# Patient Record
Sex: Male | Born: 1995 | Race: Black or African American | Hispanic: No | Marital: Single | Smoking: Never smoker
Health system: Southern US, Community
[De-identification: ages and names within clinical notes are randomized; demographics above are authoritative.]

## PROBLEM LIST (undated history)

## (undated) DIAGNOSIS — F909 Attention-deficit hyperactivity disorder, unspecified type: Secondary | ICD-10-CM

## (undated) HISTORY — PX: NO PAST SURGERIES: SHX2092

---
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
## 2017-03-31 ENCOUNTER — Emergency Department
Admission: EM | Admit: 2017-03-31 | Discharge: 2017-03-31 | Disposition: A | Discharge status: Home / Self Care | Payer: Medicaid Other | Attending: Student in an Organized Health Care Education/Training Program | Admitting: Student in an Organized Health Care Education/Training Program

## 2017-03-31 DIAGNOSIS — K668 Other specified disorders of peritoneum: Secondary | ICD-10-CM

## 2017-03-31 DIAGNOSIS — R1907 Generalized intra-abdominal and pelvic swelling, mass and lump: Secondary | ICD-10-CM | POA: Insufficient documentation

## 2017-03-31 DIAGNOSIS — R109 Unspecified abdominal pain: Secondary | ICD-10-CM | POA: Diagnosis present

## 2017-03-31 MED ORDER — IBUPROFEN 600 MG PO TABS
600.0000 mg | ORAL_TABLET | Freq: Once | ORAL | Status: AC
  Administered 2017-03-31: 600 mg via ORAL
  Filled 2017-03-31: qty 1

## 2017-03-31 MED ORDER — SULFAMETHOXAZOLE-TRIMETHOPRIM 800-160 MG PO TABS: 1.0000 | ORAL_TABLET | Freq: Two times a day (BID) | ORAL | 0 refills | Status: DC

## 2017-03-31 MED ORDER — SULFAMETHOXAZOLE-TRIMETHOPRIM 800-160 MG PO TABS
1.0000 | ORAL_TABLET | Freq: Once | ORAL | Status: AC
  Administered 2017-03-31: 1 via ORAL
  Filled 2017-03-31: qty 1

## 2017-03-31 MED ORDER — OXYCODONE-ACETAMINOPHEN 5-325 MG PO TABS
1.0000 | ORAL_TABLET | Freq: Once | ORAL | Status: AC
  Administered 2017-03-31: 1 via ORAL
  Filled 2017-03-31: qty 1

## 2017-03-31 MED ORDER — LIDOCAINE HCL (PF) 1 % IJ SOLN
INTRAMUSCULAR | Status: AC
  Filled 2017-03-31: qty 5

## 2017-03-31 MED ORDER — TRAMADOL HCL 50 MG PO TABS: 50.0000 mg | ORAL_TABLET | Freq: Two times a day (BID) | ORAL | 0 refills | Status: DC | PRN

## 2017-03-31 MED ORDER — ONDANSETRON HCL 8 MG PO TABS: 8.0000 mg | ORAL_TABLET | Freq: Three times a day (TID) | ORAL | 0 refills | Status: DC | PRN

## 2017-03-31 NOTE — ED Notes (Signed)
See triage note  States he noticed a "knot" to mid abd about 3 months ago   Denies any injury  Or n/v/d  On arrival abd flat  And slightly tender on palpation

## 2017-03-31 NOTE — ED Triage Notes (Addendum)
Pt came to ED via pov c/o feeling a knot in stomach going on for past 3 months, having pain.

## 2017-03-31 NOTE — ED Provider Notes (Signed)
[facility] Emergency Department Provider Note   ____________________________________________   First MD Initiated Contact with Patient 03/31/17 1459     (approximate)  I have reviewed the triage vital signs and the nursing notes.   HISTORY  Chief Complaint Abdominal Pain    HPI Kyle Porter is a 21 y.o. male patient present with mother for complaint of a mass above the umbilicus with past 3 months.Patient states has increased in size past 3 weeks. Patient denies provocative or palliative measures for complaint. Mother and patient have cell phones on attempting to live stream any procedure performed today.advised patient and mother about policy against recording procedures.mother states she does not believe there is a policy at the exit  He room to get her a copy. After reading the policy  mother put down her cell phone but the patient refused to put down his cell phone and continue talking stating he is not recording. Patient refused to let me see the front of the phone.  History reviewed. No pertinent past medical history.  There are no active problems to display for this patient.   History reviewed. No pertinent surgical history.  Prior to Admission medications   Medication Sig Start Date End Date Taking? Authorizing Provider  HYDROcodone-acetaminophen (NORCO) 5-325 MG per tablet Take 1 tablet by mouth every 4 (four) hours as needed for moderate pain. 03/24/15   Ignacia Bayley, PA-C  sulfamethoxazole-trimethoprim (BACTRIM DS,SEPTRA DS) 800-160 MG tablet Take 1 tablet by mouth 2 (two) times daily. 03/31/17   Joni Reining, PA-C  traMADol (ULTRAM) 50 MG tablet Take 1 tablet (50 mg total) by mouth every 12 (twelve) hours as needed. 03/31/17   Joni Reining, PA-C    Allergies Patient has no known allergies.  No family history on file.  Social History Social History  Substance Use Topics  . Smoking status: Not on file  . Smokeless tobacco: Not on  file  . Alcohol use Not on file    Review of Systems  Constitutional: No fever/chills Eyes: No visual changes. ENT: No sore throat. Cardiovascular: Denies chest pain. Respiratory: Denies shortness of breath. Gastrointestinal: No abdominal pain.  No nausea, no vomiting.  No diarrhea.  No constipation. Genitourinary: Negative for dysuria. Musculoskeletal: Negative for back pain. Skin: Negative for rash. Nodule lesion superior aspect of umbilicus. Neurological: Negative for headaches, focal weakness or numbness.   ____________________________________________   PHYSICAL EXAM:  VITAL SIGNS: ED Triage Vitals  Enc Vitals Group     BP 03/31/17 1257 133/61     Pulse Rate 03/31/17 1257 76     Resp 03/31/17 1257 16     Temp 03/31/17 1257 97.9 F (36.6 C)     Temp Source 03/31/17 1257 Oral     SpO2 03/31/17 1257 100 %     Weight 03/31/17 1256 160 lb (72.6 kg)     Height 03/31/17 1256  (1.854 m)     Head Circumference --      Peak Flow --      Pain Score --      Pain Loc --      Pain Edu? --      Excl. in GC? --     Constitutional: Alert and oriented. Well appearing and in no acute distress. Cardiovascular: Normal rate, regular rhythm. Grossly normal heart sounds.  Good peripheral circulation. Respiratory: Normal respiratory effort.  No retractions. Lungs CTAB. Gastrointestinal: Soft and nontender. No distention. No abdominal bruits. No CVA tenderness.  Musculoskeletal: No lower extremity tenderness nor edema.  No joint effusions. Neurologic:  Normal speech and language. No gross focal neurologic deficits are appreciated. No gait instability. Skin:  Skin is warm, dry and intact. No rash noted. 1 cm nodule lesion uperior aspect of the umbilicus. Lesions on erythematous base. Psychiatric: Mood and affect are normal. Speech and behavior are normal.  ____________________________________________   LABS (all labs ordered are listed, but only abnormal results are  displayed)  Labs Reviewed - No data to display ____________________________________________  EKG   ____________________________________________  RADIOLOGY  No results found.  ____________________________________________   PROCEDURES  Procedure(s) performed: INCISION AND DRAINAGE Performed by: Joni Reining Consent: Verbal consent obtained. Risks and benefits: risks, benefits and alternatives were discussed Type: abscess  Body area: superior aspect of her umbilicus Anesthesia: local infiltration  Incision was made with a scalpel.  Local anesthetic: lidocaine 1% without epinephrine  Anesthetic total: 3ml  Complexity: complex Blunt dissection to break up loculations  Drainage: purulent  Drainage amount: small  Verline Lema was cleaned and bandaged.  Patient tolerance: Patient tolerated the procedure well with no immediate complications.     Procedures  Critical Care performed: No  ____________________________________________   INITIAL IMPRESSION / ASSESSMENT AND PLAN / ED COURSE  Pertinent labs & imaging results that were available during my care of the patient were reviewed by me and considered in my medical decision making (see chart for details).  Infected cyst superior aspect of umbilicus. Area was incised and drained. Patient given discharge care instructions and work note  Advised take medication as directed.      ____________________________________________   FINAL CLINICAL IMPRESSION(S) / ED DIAGNOSES  Final diagnoses:  Cystic lesion of abdominal viscera      NEW MEDICATIONS STARTED DURING THIS VISIT:  New Prescriptions   SULFAMETHOXAZOLE-TRIMETHOPRIM (BACTRIM DS,SEPTRA DS) 800-160 MG TABLET    Take 1 tablet by mouth 2 (two) times daily.   TRAMADOL (ULTRAM) 50 MG TABLET    Take 1 tablet (50 mg total) by mouth every 12 (twelve) hours as needed.     Note:  This document was prepared using Dragon voice recognition software and may include  unintentional dictation errors.    Joni Reining, PA-C 03/31/17 1601    Willy Eddy, MD 03/31/17 [phone]

## 2017-09-03 ENCOUNTER — Emergency Department: Payer: Self-pay

## 2017-09-03 ENCOUNTER — Encounter: Payer: Self-pay | Admitting: Emergency Medicine

## 2017-09-03 ENCOUNTER — Emergency Department
Admission: EM | Admit: 2017-09-03 | Discharge: 2017-09-03 | Disposition: A | Discharge status: Home / Self Care | Payer: Self-pay | Attending: Emergency Medicine | Admitting: Emergency Medicine

## 2017-09-03 ENCOUNTER — Other Ambulatory Visit: Payer: Self-pay

## 2017-09-03 DIAGNOSIS — R040 Epistaxis: Secondary | ICD-10-CM | POA: Insufficient documentation

## 2017-09-03 DIAGNOSIS — Z79899 Other long term (current) drug therapy: Secondary | ICD-10-CM | POA: Insufficient documentation

## 2017-09-03 DIAGNOSIS — Y929 Unspecified place or not applicable: Secondary | ICD-10-CM | POA: Insufficient documentation

## 2017-09-03 DIAGNOSIS — Y998 Other external cause status: Secondary | ICD-10-CM | POA: Insufficient documentation

## 2017-09-03 DIAGNOSIS — S0033XA Contusion of nose, initial encounter: Secondary | ICD-10-CM | POA: Insufficient documentation

## 2017-09-03 DIAGNOSIS — Y939 Activity, unspecified: Secondary | ICD-10-CM | POA: Insufficient documentation

## 2017-09-03 MED ORDER — NAPROXEN 500 MG PO TABS: 500.0000 mg | ORAL_TABLET | Freq: Two times a day (BID) | ORAL | Status: DC

## 2017-09-03 MED ORDER — TRAMADOL HCL 50 MG PO TABS
50.0000 mg | ORAL_TABLET | Freq: Once | ORAL | Status: AC
  Administered 2017-09-03: 50 mg via ORAL
  Filled 2017-09-03: qty 1

## 2017-09-03 MED ORDER — NAPROXEN 500 MG PO TABS
500.0000 mg | ORAL_TABLET | Freq: Once | ORAL | Status: AC
  Administered 2017-09-03: 500 mg via ORAL
  Filled 2017-09-03: qty 1
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
## 2017-09-03 NOTE — ED Notes (Signed)
Pt verbalizes understanding of d/c instructions, medications and follow up 
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
## 2017-09-03 NOTE — ED Notes (Signed)
Patient to Flex with Legrand ComoLinda RN.

## 2017-11-07 ENCOUNTER — Encounter: Payer: Self-pay | Admitting: Emergency Medicine

## 2017-11-07 ENCOUNTER — Emergency Department: Payer: No Typology Code available for payment source

## 2017-11-07 ENCOUNTER — Emergency Department
Admission: EM | Admit: 2017-11-07 | Discharge: 2017-11-07 | Disposition: A | Discharge status: Home / Self Care | Payer: No Typology Code available for payment source | Attending: Emergency Medicine | Admitting: Emergency Medicine

## 2017-11-07 ENCOUNTER — Other Ambulatory Visit: Payer: Self-pay

## 2017-11-07 DIAGNOSIS — Y999 Unspecified external cause status: Secondary | ICD-10-CM | POA: Diagnosis not present

## 2017-11-07 DIAGNOSIS — M62838 Other muscle spasm: Secondary | ICD-10-CM | POA: Insufficient documentation

## 2017-11-07 DIAGNOSIS — Y92410 Unspecified street and highway as the place of occurrence of the external cause: Secondary | ICD-10-CM | POA: Diagnosis not present

## 2017-11-07 DIAGNOSIS — M542 Cervicalgia: Secondary | ICD-10-CM | POA: Insufficient documentation

## 2017-11-07 DIAGNOSIS — Y9389 Activity, other specified: Secondary | ICD-10-CM | POA: Diagnosis not present

## 2017-11-07 DIAGNOSIS — S199XXA Unspecified injury of neck, initial encounter: Secondary | ICD-10-CM | POA: Diagnosis present

## 2017-11-07 HISTORY — DX: Attention-deficit hyperactivity disorder, unspecified type: F90.9

## 2017-11-07 MED ORDER — MELOXICAM 15 MG PO TABS: 15.0000 mg | ORAL_TABLET | Freq: Every day | ORAL | 2 refills | Status: AC

## 2017-11-07 MED ORDER — BACLOFEN 10 MG PO TABS: 10.0000 mg | ORAL_TABLET | Freq: Every day | ORAL | 0 refills | Status: AC

## 2017-11-07 NOTE — Discharge Instructions (Addendum)
Follow-up with your regular doctor or Dr. Rosita KeaMenz if you are not better in 5 to 7 days.  Use medications prescribed.  Apply ice to any areas that hurt.  Return to emergency department if your worsening
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
## 2017-12-14 ENCOUNTER — Other Ambulatory Visit: Payer: Self-pay

## 2017-12-14 ENCOUNTER — Encounter: Payer: Self-pay | Admitting: Emergency Medicine

## 2017-12-14 ENCOUNTER — Emergency Department
Admission: EM | Admit: 2017-12-14 | Discharge: 2017-12-14 | Disposition: A | Discharge status: Home / Self Care | Payer: Medicaid Other | Attending: Emergency Medicine | Admitting: Emergency Medicine

## 2017-12-14 DIAGNOSIS — F909 Attention-deficit hyperactivity disorder, unspecified type: Secondary | ICD-10-CM | POA: Insufficient documentation

## 2017-12-14 DIAGNOSIS — R110 Nausea: Secondary | ICD-10-CM | POA: Insufficient documentation

## 2017-12-14 DIAGNOSIS — R197 Diarrhea, unspecified: Secondary | ICD-10-CM | POA: Insufficient documentation

## 2017-12-14 DIAGNOSIS — R109 Unspecified abdominal pain: Secondary | ICD-10-CM | POA: Insufficient documentation

## 2017-12-14 DIAGNOSIS — Z79899 Other long term (current) drug therapy: Secondary | ICD-10-CM | POA: Insufficient documentation

## 2017-12-14 LAB — URINALYSIS, ROUTINE W REFLEX MICROSCOPIC
BILIRUBIN URINE: NEGATIVE
GLUCOSE, UA: NEGATIVE mg/dL
Hgb urine dipstick: NEGATIVE
KETONES UR: NEGATIVE mg/dL
LEUKOCYTES UA: NEGATIVE
Nitrite: NEGATIVE
PH: 5 (ref 5.0–8.0)
PROTEIN: NEGATIVE mg/dL
Specific Gravity, Urine: 1.017 (ref 1.005–1.030)

## 2017-12-14 LAB — COMPREHENSIVE METABOLIC PANEL
ALT: 21 U/L (ref 17–63)
AST: 24 U/L (ref 15–41)
Albumin: 5.2 g/dL — ABNORMAL HIGH (ref 3.5–5.0)
Alkaline Phosphatase: 70 U/L (ref 38–126)
Anion gap: 7 (ref 5–15)
BILIRUBIN TOTAL: 0.8 mg/dL (ref 0.3–1.2)
BUN: 9 mg/dL (ref 6–20)
CHLORIDE: 104 mmol/L (ref 101–111)
CO2: 24 mmol/L (ref 22–32)
Calcium: 9.4 mg/dL (ref 8.9–10.3)
Creatinine, Ser: 0.94 mg/dL (ref 0.61–1.24)
GFR calc Af Amer: >60 mL/min (ref >60)
Glucose, Bld: 111 mg/dL — ABNORMAL HIGH (ref 65–99)
Potassium: 3.8 mmol/L (ref 3.5–5.1)
Sodium: 135 mmol/L (ref 135–145)
Total Protein: 8.8 g/dL — ABNORMAL HIGH (ref 6.5–8.1)

## 2017-12-14 LAB — CBC
HCT: 51 % (ref 40.0–52.0)
Hemoglobin: 17.2 g/dL (ref 13.0–18.0)
MCH: 31.4 pg (ref 26.0–34.0)
MCHC: 33.7 g/dL (ref 32.0–36.0)
MCV: 93 fL (ref 80.0–100.0)
Platelets: 222 10*3/uL (ref 150–440)
RBC: 5.48 MIL/uL (ref 4.40–5.90)
RDW: 12.3 % (ref 11.5–14.5)
WBC: 9.8 10*3/uL (ref 3.8–10.6)

## 2017-12-14 LAB — LIPASE, BLOOD: LIPASE: 25 U/L (ref 11–51)

## 2017-12-14 MED ORDER — SODIUM CHLORIDE 0.9 % IV BOLUS
1000.0000 mL | Freq: Once | INTRAVENOUS | Status: AC
  Administered 2017-12-14: 1000 mL via INTRAVENOUS

## 2017-12-14 MED ORDER — LOPERAMIDE HCL 2 MG PO TABS: 2.0000 mg | ORAL_TABLET | Freq: Four times a day (QID) | ORAL | 0 refills | Status: DC | PRN

## 2017-12-14 MED ORDER — ONDANSETRON HCL 4 MG/2ML IJ SOLN
4.0000 mg | Freq: Once | INTRAMUSCULAR | Status: AC
  Administered 2017-12-14: 4 mg via INTRAVENOUS
  Filled 2017-12-14: qty 2

## 2017-12-14 MED ORDER — ONDANSETRON 4 MG PO TBDP: 4.0000 mg | ORAL_TABLET | Freq: Three times a day (TID) | ORAL | 0 refills | Status: DC | PRN

## 2017-12-14 NOTE — ED Notes (Signed)
ED Provider at bedside. 

## 2017-12-14 NOTE — ED Provider Notes (Signed)
[facility] Emergency Department Provider Note  Time seen: 3:48 PM  I have reviewed the triage vital signs and the nursing notes.   HISTORY  Chief Complaint Abdominal Pain and Diarrhea    HPI Kyle Porter is a 22 y.o. male with a past medical history of ADHD presents to the emergency department for diarrhea and abdominal cramping.  According to the patient for the past 6 or 7 days he has been having daily episodes of diarrhea and abdominal cramping.  States the abdominal cramping is largely resolved after having diarrhea.  States nausea but denies vomiting.  Denies any fever.  Denies any black or bloody stool.  Patient states he has had several bowel movements today, took Imodium for the first time today.  Denies any current abdominal pain.  Largely negative review of systems otherwise.   Past Medical History:  Diagnosis Date  . ADHD     There are no active problems to display for this patient.   History reviewed. No pertinent surgical history.  Prior to Admission medications   Medication Sig Start Date End Date Taking? Authorizing Provider  amphetamine-dextroamphetamine (ADDERALL) 30 MG tablet Take 30 mg by mouth daily.    [provider]  baclofen (LIORESAL) 10 MG tablet Take 1 tablet (10 mg total) by mouth daily. 11/07/17 11/07/18  Fisher, Roselyn BeringSusan W, PA-C  meloxicam (MOBIC) 15 MG tablet Take 1 tablet (15 mg total) by mouth daily. 11/07/17 11/07/18  Faythe GheeFisher, Susan W, PA-C    No Known Allergies  No family history on file.  Social History Social History   Tobacco Use  . Smoking status: Never Smoker  . Smokeless tobacco: Never Used  Substance Use Topics  . Alcohol use: No    Frequency: Never  . Drug use: Not on file   Review of Systems Constitutional: Negative for fever. Eyes: Negative for visual complaints  ENT: Negative for recent illness/congestion Cardiovascular: Negative for chest pain. Respiratory: Negative for shortness of  breath. Gastrointestinal: Abdominal cramping, diarrhea.  Negative for vomiting.  Occasional nausea. Genitourinary: Negative for urinary compaints Musculoskeletal: Negative for musculoskeletal complaints Skin: Negative for skin complaints  Neurological: Negative for headache All other ROS negative  ____________________________________________   PHYSICAL EXAM:  VITAL SIGNS: ED Triage Vitals  Enc Vitals Group     BP 12/14/17 1215 135/85     Pulse Rate 12/14/17 1215 (!) 121     Resp 12/14/17 1215 18     Temp 12/14/17 1215 98.1 F (36.7 C)     Temp Source 12/14/17 1215 Oral     SpO2 12/14/17 1215 99 %     Weight 12/14/17 1217 160 lb (72.6 kg)     Height 12/14/17 1217 6' 1.5" (1.867 m)     Head Circumference --      Peak Flow --      Pain Score 12/14/17 1216 8     Pain Loc --      Pain Edu? --      Excl. in GC? --    Constitutional: Alert and oriented. Well appearing and in no distress. Eyes: Normal exam ENT   Head: Normocephalic and atraumatic.   Mouth/Throat: Mucous membranes are moist. Cardiovascular: Normal rate, regular rhythm.  Respiratory: Normal respiratory effort without tachypnea nor retractions. Breath sounds are clear  Gastrointestinal: Soft and nontender. No distention.  Patient laughs during abdominal exam. Musculoskeletal: Nontender with normal range of motion in all extremities.  Neurologic:  Normal speech and language. No gross focal  neurologic deficits  Skin:  Skin is warm, dry and intact.  Psychiatric: Mood and affect are normal.   ____________________________________________   INITIAL IMPRESSION / ASSESSMENT AND PLAN / ED COURSE  Pertinent labs & imaging results that were available during my care of the patient were reviewed by me and considered in my medical decision making (see chart for details).  Patient presents to the emergency department for diarrhea and abdominal cramping ongoing for 6 days.  Took Imodium for the first time today.   States several loose stools today.  Overall the patient appears well, largely normal vitals with mild tachycardia, nontender abdomen.  We will dose IV fluids, attempt to obtain a urine and stool sample and continue to closely monitor.  Reassuringly patient's white blood cell count is normal, electrolytes are normal, anion gap is normal.  Is now been over 3 hours since my initial evaluation.  Patient has still not been able to produce a stool sample.  Urinalysis normal.  Labs are otherwise normal.  I discussed using Imodium, Zofran as needed.  We will discharge home with PCP follow-up  ____________________________________________   FINAL CLINICAL IMPRESSION(S) / ED DIAGNOSES  Diarrhea    Minna Antis, MD 12/14/17 [phone]

## 2017-12-14 NOTE — ED Notes (Signed)
Pt given urinal to produce urine specimen. Pt denies having to have BM. Paduchowski MD aware.

## 2017-12-14 NOTE — ED Triage Notes (Signed)
Lower abd pain and diarrhea x 5 days.

## 2018-03-28 ENCOUNTER — Encounter: Payer: Self-pay | Admitting: Emergency Medicine

## 2018-03-28 ENCOUNTER — Other Ambulatory Visit: Payer: Self-pay

## 2018-03-28 ENCOUNTER — Emergency Department
Admission: EM | Admit: 2018-03-28 | Discharge: 2018-03-28 | Disposition: A | Discharge status: Home / Self Care | Payer: Medicaid Other | Attending: Emergency Medicine | Admitting: Emergency Medicine

## 2018-03-28 DIAGNOSIS — Z79899 Other long term (current) drug therapy: Secondary | ICD-10-CM | POA: Insufficient documentation

## 2018-03-28 DIAGNOSIS — F909 Attention-deficit hyperactivity disorder, unspecified type: Secondary | ICD-10-CM | POA: Insufficient documentation

## 2018-03-28 DIAGNOSIS — S80862A Insect bite (nonvenomous), left lower leg, initial encounter: Secondary | ICD-10-CM

## 2018-03-28 DIAGNOSIS — Y9389 Activity, other specified: Secondary | ICD-10-CM | POA: Insufficient documentation

## 2018-03-28 DIAGNOSIS — W57XXXA Bitten or stung by nonvenomous insect and other nonvenomous arthropods, initial encounter: Secondary | ICD-10-CM

## 2018-03-28 DIAGNOSIS — Y929 Unspecified place or not applicable: Secondary | ICD-10-CM | POA: Insufficient documentation

## 2018-03-28 DIAGNOSIS — Y998 Other external cause status: Secondary | ICD-10-CM | POA: Insufficient documentation

## 2018-03-28 MED ORDER — SULFAMETHOXAZOLE-TRIMETHOPRIM 800-160 MG PO TABS: 2.0000 | ORAL_TABLET | Freq: Two times a day (BID) | ORAL | 0 refills | Status: DC

## 2018-03-28 MED ORDER — HYDROCODONE-ACETAMINOPHEN 5-325 MG PO TABS
1.0000 | ORAL_TABLET | Freq: Once | ORAL | Status: AC
Start: 2018-03-28 — End: 2018-03-28
  Administered 2018-03-28: 1 via ORAL
  Filled 2018-03-28: qty 1

## 2018-03-28 MED ORDER — IBUPROFEN 800 MG PO TABS
800.0000 mg | ORAL_TABLET | Freq: Once | ORAL | Status: AC
  Administered 2018-03-28: 800 mg via ORAL
  Filled 2018-03-28: qty 1

## 2018-03-28 NOTE — ED Triage Notes (Signed)
Patient ambulatory to triage with steady gait, without difficulty or distress noted; pt reports he was bit by a spider PTA to back of left lower leg; no redness or swelling noted to site indicated by pt

## 2018-03-28 NOTE — Discharge Instructions (Signed)
1.  Start the antibiotic in 2 to 3 days only if you see ulceration, redness or streaking to your leg. 2.  Return to the ER for worsening symptoms, persistent vomiting, difficulty breathing, fever or other concerns.

## 2018-03-28 NOTE — ED Provider Notes (Signed)
[facility] Emergency Department Provider Note   ____________________________________________   First MD Initiated Contact with Patient 03/28/18 0138     (approximate)  I have reviewed the triage vital signs and the nursing notes.   HISTORY  Chief Complaint Insect Bite    HPI Kyle Porter is a 22 y.o. male who presents to the ED from home with a chief complaint of possible spider bite.  Patient felt a bite to the back of his left lower leg approximately 2 hours prior to arrival.  His mother saw it and reports it looks like a brown spider.  Presents with pain to the affected area and concerned that he was bitten by a venomous spider.  Denies fever, chills, chest pain, shortness of breath, abdominal pain, nausea, vomiting or diarrhea.   Past Medical History:  Diagnosis Date  . ADHD     There are no active problems to display for this patient.   History reviewed. No pertinent surgical history.  Prior to Admission medications   Medication Sig Start Date End Date Taking? Authorizing Provider  amphetamine-dextroamphetamine (ADDERALL) 30 MG tablet Take 30 mg by mouth daily.    [provider]  baclofen (LIORESAL) 10 MG tablet Take 1 tablet (10 mg total) by mouth daily. Patient not taking: Reported on 12/14/2017 11/07/17 11/07/18  Faythe Ghee, PA-C  loperamide (IMODIUM A-D) 2 MG tablet Take 1 tablet (2 mg total) by mouth 4 (four) times daily as needed for diarrhea or loose stools. 12/14/17   Minna Antis, MD  meloxicam (MOBIC) 15 MG tablet Take 1 tablet (15 mg total) by mouth daily. Patient not taking: Reported on 12/14/2017 11/07/17 11/07/18  Sherrie Mustache Roselyn Bering, PA-C  ondansetron (ZOFRAN ODT) 4 MG disintegrating tablet Take 1 tablet (4 mg total) by mouth every 8 (eight) hours as needed for nausea or vomiting. 12/14/17   Minna Antis, MD    Allergies Patient has no known allergies.  No family history on file.  Social History Social  History   Tobacco Use  . Smoking status: Never Smoker  . Smokeless tobacco: Never Used  Substance Use Topics  . Alcohol use: No    Frequency: Never  . Drug use: Not on file    Review of Systems  Constitutional: No fever/chills Eyes: No visual changes. ENT: No sore throat. Cardiovascular: Denies chest pain. Respiratory: Denies shortness of breath. Gastrointestinal: No abdominal pain.  No nausea, no vomiting.  No diarrhea.  No constipation. Genitourinary: Negative for dysuria. Musculoskeletal: Positive for insect bite to left leg.  Negative for back pain. Skin: Negative for rash. Neurological: Negative for headaches, focal weakness or numbness.   ____________________________________________   PHYSICAL EXAM:  VITAL SIGNS: ED Triage Vitals  Enc Vitals Group     BP 03/28/18 0040 (!) 142/73     Pulse Rate 03/28/18 0040 65     Resp 03/28/18 0040 18     Temp 03/28/18 0040 98 F (36.7 C)     Temp Source 03/28/18 0040 Oral     SpO2 03/28/18 0040 100 %     Weight 03/28/18 0040 175 lb (79.4 kg)     Height 03/28/18 0040 6\' 2"  (1.88 m)     Head Circumference --      Peak Flow --      Pain Score 03/28/18 0039 5     Pain Loc --      Pain Edu? --      Excl. in GC? --  Constitutional: Alert and oriented. Well appearing and in no acute distress. Eyes: Conjunctivae are normal. PERRL. EOMI. Head: Atraumatic. Nose: No congestion/rhinnorhea. Mouth/Throat: Mucous membranes are moist.  Oropharynx non-erythematous. Neck: No stridor.   Cardiovascular: Normal rate, regular rhythm. Grossly normal heart sounds.  Good peripheral circulation. Respiratory: Normal respiratory effort.  No retractions. Lungs CTAB. Gastrointestinal: Soft and nontender. No distention. No abdominal bruits. No CVA tenderness. Musculoskeletal: No fang marks noted to affected area.  There is a slight reddened area which is the site of the bite.  No tick or foreign body noted.  No pedal edema.  No joint effusions.   2+ distal pulses.  Brisk, less than 5-second capillary refill. Neurologic:  Normal speech and language. No gross focal neurologic deficits are appreciated. No gait instability. Skin:  Skin is warm, dry and intact. No rash noted. Psychiatric: Mood and affect are normal. Speech and behavior are normal.  ____________________________________________   LABS (all labs ordered are listed, but only abnormal results are displayed)  Labs Reviewed - No data to display ____________________________________________  EKG  None ____________________________________________  RADIOLOGY  ED MD interpretation: None  Official radiology report(s): No results found.  ____________________________________________   PROCEDURES  Procedure(s) performed: None  Procedures  Critical Care performed: No  ____________________________________________   INITIAL IMPRESSION / ASSESSMENT AND PLAN / ED COURSE  As part of my medical decision making, I reviewed the following data within the electronic MEDICAL RECORD NUMBER History obtained from family, Nursing notes reviewed and incorporated and Notes from prior ED visits   22 year old otherwise healthy male who presents with possible spider bites.  I discussed with the patient and his mother and will write a prescription for Bactrim 2 tablets twice daily for 10 days.  Patient is given strict instructions not to start the antibiotic unless he sees signs of ulceration or streaking.  Strict return precautions given.  Both verbalize understanding and agree with plan of care.      ____________________________________________   FINAL CLINICAL IMPRESSION(S) / ED DIAGNOSES  Final diagnoses:  Insect bite of left lower leg, initial encounter     ED Discharge Orders    None       Note:  This document was prepared using Dragon voice recognition software and may include unintentional dictation errors.    Irean HongSung, Jennfer Gassen J, MD 03/28/18 [id number]

## 2020-05-17 ENCOUNTER — Other Ambulatory Visit: Payer: Self-pay

## 2020-05-17 ENCOUNTER — Emergency Department
Admission: EM | Admit: 2020-05-17 | Discharge: 2020-05-17 | Disposition: A | Discharge status: Home / Self Care | Payer: Worker's Compensation | Attending: Student in an Organized Health Care Education/Training Program | Admitting: Student in an Organized Health Care Education/Training Program

## 2020-05-17 ENCOUNTER — Encounter: Payer: Self-pay | Admitting: Emergency Medicine

## 2020-05-17 DIAGNOSIS — H10212 Acute toxic conjunctivitis, left eye: Secondary | ICD-10-CM | POA: Diagnosis not present

## 2020-05-17 DIAGNOSIS — T520X1A Toxic effect of petroleum products, accidental (unintentional), initial encounter: Secondary | ICD-10-CM | POA: Diagnosis not present

## 2020-05-17 DIAGNOSIS — H5712 Ocular pain, left eye: Secondary | ICD-10-CM | POA: Diagnosis present

## 2020-05-17 MED ORDER — EYE WASH OPHTH SOLN
1.0000 [drp] | OPHTHALMIC | Status: DC | PRN
  Administered 2020-05-17: 1 [drp] via OPHTHALMIC
  Filled 2020-05-17: qty 118

## 2020-05-17 MED ORDER — GENTAMICIN SULFATE 0.3 % OP SOLN: 1.0000 [drp] | Freq: Four times a day (QID) | OPHTHALMIC | 0 refills | Status: DC

## 2020-05-17 MED ORDER — TETRACAINE HCL 0.5 % OP SOLN
2.0000 [drp] | Freq: Once | OPHTHALMIC | Status: AC
  Administered 2020-05-17: 2 [drp] via OPHTHALMIC
  Filled 2020-05-17: qty 4

## 2020-05-17 MED ORDER — FLUORESCEIN SODIUM 1 MG OP STRP
1.0000 | ORAL_STRIP | Freq: Once | OPHTHALMIC | Status: AC
  Administered 2020-05-17: 1 via OPHTHALMIC
  Filled 2020-05-17: qty 1

## 2020-05-17 NOTE — ED Notes (Signed)
Visual acuity 20/20 left eye and 20/20 right eye

## 2020-05-17 NOTE — ED Provider Notes (Signed)
[facility] Emergency Department Provider Note   ____________________________________________   First MD Initiated Contact with Patient 05/17/20 [phone]     (approximate)  I have reviewed the triage vital signs and the nursing notes.   HISTORY  Chief Complaint Eye Problem    HPI Kyle Porter is a 24 y.o. male patient complain of left eye pain secondary to chemical exposure.  Patient state approximate 2 hours prior to arrival he got a drop of motor oil in his left eye.  Patient said he was irrigated for 10 to 15 minutes at work.  No other palliative measure for complaint.  Continues to have mild pain.  Rates pain as a 7/10.  Denies vision disturbance.         Past Medical History:  Diagnosis Date  . ADHD     There are no problems to display for this patient.   History reviewed. No pertinent surgical history.  Prior to Admission medications   Medication Sig Start Date End Date Taking? Authorizing Provider  amphetamine-dextroamphetamine (ADDERALL) 30 MG tablet Take 30 mg by mouth daily.    [provider]  gentamicin (GARAMYCIN) 0.3 % ophthalmic solution Place 1 drop into the left eye 4 (four) times daily. 05/17/20   Joni Reining, PA-C  loperamide (IMODIUM A-D) 2 MG tablet Take 1 tablet (2 mg total) by mouth 4 (four) times daily as needed for diarrhea or loose stools. 12/14/17   Minna Antis, MD  ondansetron (ZOFRAN ODT) 4 MG disintegrating tablet Take 1 tablet (4 mg total) by mouth every 8 (eight) hours as needed for nausea or vomiting. 12/14/17   Minna Antis, MD  sulfamethoxazole-trimethoprim (BACTRIM DS,SEPTRA DS) 800-160 MG tablet Take 2 tablets by mouth 2 (two) times daily. 03/28/18   Irean Hong, MD    Allergies Patient has no known allergies.  No family history on file.  Social History Social History   Tobacco Use  . Smoking status: Never Smoker  . Smokeless tobacco: Never Used  Substance Use Topics  . Alcohol  use: No  . Drug use: Not on file    Review of Systems Constitutional: No fever/chills Eyes: No visual changes.  No eye pain. ENT: No sore throat. Cardiovascular: Denies chest pain. Respiratory: Denies shortness of breath. Gastrointestinal: No abdominal pain.  No nausea, no vomiting.  No diarrhea.  No constipation. Genitourinary: Negative for dysuria. Musculoskeletal: Negative for back pain. Skin: Negative for rash. Neurological: Negative for headaches, focal weakness or numbness. Psychiatric:  ADHD.  ____________________________________________   PHYSICAL EXAM:  VITAL SIGNS: ED Triage Vitals  Enc Vitals Group     BP 05/17/20 0326 126/71     Pulse Rate 05/17/20 0326 65     Resp 05/17/20 0326 18     Temp 05/17/20 0326 98.8 F (37.1 C)     Temp Source 05/17/20 0326 Oral     SpO2 05/17/20 0326 99 %     Weight 05/17/20 0327 166 lb (75.3 kg)     Height 05/17/20 0327 6\' 2"  (1.88 m)     Head Circumference --      Peak Flow --      Pain Score 05/17/20 0327 7     Pain Loc --      Pain Edu? --      Excl. in GC? --     Constitutional: Alert and oriented. Well appearing and in no acute distress. Eyes: Conjunctivae are normal. PERRL. EOMI. visual acuity is 20/20 bilateral for  correction.  No foreign body or corneal abrasion status post fluorescein stain exam. Cardiovascular: Normal rate, regular rhythm. Grossly normal heart sounds.  Good peripheral circulation. Respiratory: Normal respiratory effort.  No retractions. Lungs CTAB. Neurologic:  Normal speech and language. No gross focal neurologic deficits are appreciated. No gait instability. Skin:  Skin is warm, dry and intact. No rash noted. Psychiatric: Mood and affect are normal. Speech and behavior are normal.  ____________________________________________   LABS (all labs ordered are listed, but only abnormal results are displayed)  Labs Reviewed - No data to  display ____________________________________________  EKG   ____________________________________________  RADIOLOGY I, Joni Reining, personally viewed and evaluated these images (plain radiographs) as part of my medical decision making, as well as reviewing the written report by the radiologist.  ED MD interpretation:    Official radiology report(s): No results found.  ____________________________________________   PROCEDURES  Procedure(s) performed (including Critical Care):  Procedures   ____________________________________________   INITIAL IMPRESSION / ASSESSMENT AND PLAN / ED COURSE  As part of my medical decision making, I reviewed the following data within the electronic MEDICAL RECORD NUMBER      Patient presents with left eye irritation status post exposure to 1 drop of motor oil.  Physical exam is grossly unremarkable.  I was reirrigated after fluorescein exam.  Patient given discharge care instructions and prophylactic antibiotic eyedrops.  Patient advised to follow-up with ophthalmology if no improvement or worsening complaint in next 2 to 3 days.         ____________________________________________   FINAL CLINICAL IMPRESSION(S) / ED DIAGNOSES  Final diagnoses:  Acute chemical conjunctivitis of left eye     ED Discharge Orders         Ordered    gentamicin (GARAMYCIN) 0.3 % ophthalmic solution  4 times daily        05/17/20 0806          *Please note:  Kyle Porter was evaluated in Emergency Department on 05/17/2020 for the symptoms described in the history of present illness. He was evaluated in the context of the global COVID-19 pandemic, which necessitated consideration that the patient might be at risk for infection with the SARS-CoV-2 virus that causes COVID-19. Institutional protocols and algorithms that pertain to the evaluation of patients at risk for COVID-19 are in a state of rapid change based on information released by regulatory  bodies including the CDC and federal and state organizations. These policies and algorithms were followed during the patient's care in the ED.  Some ED evaluations and interventions may be delayed as a result of limited staffing during and the pandemic.*   Note:  This document was prepared using Dragon voice recognition software and may include unintentional dictation errors.    Joni Reining, PA-C 05/17/20 0813    Willy Eddy, MD 05/17/20 [phone]

## 2020-05-17 NOTE — ED Notes (Signed)
Pt still c/o discomfort in left eye but reports normal vision at this time

## 2020-05-17 NOTE — ED Notes (Signed)
Incorrect WC profile previously printed with pt's chart. After finding pt's correct WC profile, this tech called pt's work [employer]ch[employer], [employer] to confirm everything the pt needs. The supervisor stated "He does not need a drug screen. He only needs to be checked out". Pt updated.

## 2020-05-17 NOTE — ED Triage Notes (Signed)
Patient ambulatory to triage with steady gait, without difficulty or distress noted; pt employed with [employer] [facility] spoke with [employer] who st while at work, approx 2hrs ago, got a "drop of motor oil" in left eye; irrigated 10-42min but cont to c/o pain; no further actions required at this time except to examine eye for any possible abrasions; workers comp profile indicates no drug testing required at this time

## 2020-05-17 NOTE — Discharge Instructions (Signed)
Follow discharge care instructions and use eyedrops as directed. 

## 2020-06-02 ENCOUNTER — Encounter: Payer: Self-pay | Admitting: Emergency Medicine

## 2020-06-02 ENCOUNTER — Other Ambulatory Visit: Payer: Self-pay

## 2020-06-02 DIAGNOSIS — M5441 Lumbago with sciatica, right side: Secondary | ICD-10-CM | POA: Insufficient documentation

## 2020-06-02 NOTE — ED Triage Notes (Signed)
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

## 2020-06-03 ENCOUNTER — Emergency Department
Admission: EM | Admit: 2020-06-03 | Discharge: 2020-06-03 | Disposition: A | Discharge status: Home / Self Care | Payer: Medicaid Other | Attending: Emergency Medicine | Admitting: Emergency Medicine

## 2020-06-03 DIAGNOSIS — M5441 Lumbago with sciatica, right side: Secondary | ICD-10-CM

## 2020-06-03 MED ORDER — LIDOCAINE 5 % EX PTCH: 1.0000 | MEDICATED_PATCH | Freq: Two times a day (BID) | CUTANEOUS | 0 refills | Status: AC

## 2020-06-03 MED ORDER — LIDOCAINE 5 % EX PTCH
1.0000 | MEDICATED_PATCH | CUTANEOUS | Status: DC
  Administered 2020-06-03: 1 via TRANSDERMAL
  Filled 2020-06-03 (×2): qty 1

## 2020-06-03 MED ORDER — CYCLOBENZAPRINE HCL 5 MG PO TABS: 5.0000 mg | ORAL_TABLET | Freq: Three times a day (TID) | ORAL | 0 refills | Status: DC | PRN

## 2020-06-03 NOTE — ED Provider Notes (Signed)
[facility] Emergency Department Provider Note  ____________________________________________   First MD Initiated Contact with Patient 06/03/20 0244     (approximate)  I have reviewed the triage vital signs and the nursing notes.   HISTORY  Chief Complaint Back Pain    HPI Kyle Porter is a 24 y.o. male who is generally well and healthy and presents for evaluation of acute onset low back pain on both sides but worse on the right and radiates down into the back of his right thigh.  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  He was playing basketball tonight and bent over and had acute onset of pain.  Is worse when he ambulates and moves in certain ways.  He has had no trouble urinating and no urinary incontinence.  No pain up higher in his flank.  No numbness nor tingling, just the aching and sharp pain that radiates into his upper rear right thigh.  He feels better now but the pain is still present.  He has tried taking ibuprofen and Tylenol but it does not seem to help.  No weakness in his leg and no difficulty with ambulation other than from the pain.         Past Medical History:  Diagnosis Date  . ADHD     There are no problems to display for this patient.   History reviewed. No pertinent surgical history.  Prior to Admission medications   Medication Sig Start Date End Date Taking? Authorizing Provider  amphetamine-dextroamphetamine (ADDERALL) 30 MG tablet Take 30 mg by mouth daily.    [provider]  cyclobenzaprine (FLEXERIL) 5 MG tablet Take 1 tablet (5 mg total) by mouth 3 (three) times daily as needed for muscle spasms. 06/03/20   Loleta Rose, MD  gentamicin (GARAMYCIN) 0.3 % ophthalmic solution Place 1 drop into the left eye 4 (four) times daily. 05/17/20   Joni Reining, PA-C  lidocaine (LIDODERM) 5 % Place 1 patch onto the skin every 12 (twelve) hours. Remove & Discard patch within 12  hours or as directed by MD.  Wynelle Fanny the patch off for 12 hours before applying a new one. 06/03/20 06/03/21  Loleta Rose, MD  loperamide (IMODIUM A-D) 2 MG tablet Take 1 tablet (2 mg total) by mouth 4 (four) times daily as needed for diarrhea or loose stools. 12/14/17   Minna Antis, MD  ondansetron (ZOFRAN ODT) 4 MG disintegrating tablet Take 1 tablet (4 mg total) by mouth every 8 (eight) hours as needed for nausea or vomiting. 12/14/17   Minna Antis, MD  sulfamethoxazole-trimethoprim (BACTRIM DS,SEPTRA DS) 800-160 MG tablet Take 2 tablets by mouth 2 (two) times daily. 03/28/18   Irean Hong, MD    Allergies Patient has no known allergies.  No family history on file.  Social History Social History   Tobacco Use  . Smoking status: Never Smoker  . Smokeless tobacco: Never Used  Substance Use Topics  . Alcohol use: No  . Drug use: Not on file    Review of Systems Constitutional: No fever/chills Gastrointestinal: No abdominal pain.  No nausea, no vomiting.   Genitourinary: Negative for dysuria.  No urinary incontinence nor urinary retention. Musculoskeletal: Low back pain radiating into the right posterior upper leg. Neurological: No focal weakness.   ____________________________________________   PHYSICAL EXAM:  VITAL SIGNS: ED Triage Vitals  Enc Vitals Group     BP 06/02/20 2312 112/65  Pulse Rate 06/02/20 2312 62     Resp 06/02/20 2312 18     Temp 06/02/20 2312 (!) 97.4 F (36.3 C)     Temp Source 06/02/20 2312 Oral     SpO2 06/02/20 2312 97 %     Weight 06/02/20 2313 75.3 kg (166 lb 0.1 oz)     Height 06/02/20 2313 1.88 m (6\' 2" )     Head Circumference --      Peak Flow --      Pain Score 06/02/20 2312 10     Pain Loc --      Pain Edu? --      Excl. in [place]? --     Constitutional: Alert and oriented.  Eyes: Conjunctivae are normal.  Head: Atraumatic. Cardiovascular: Normal rate, regular rhythm. Good peripheral circulation. Respiratory: Normal  respiratory effort.  No retractions. Musculoskeletal: Patient is ambulating well.  He has some tenderness to palpation of the soft tissue and paraspinal muscles of the right lower lumbar spine.  No tenderness to palpation of the spine itself and no palpable defects or deformities.  Increased pain with straight leg raise. Neurologic:  Normal speech and language. No gross focal neurologic deficits are appreciated.  Skin:  Skin is warm, dry and intact. Psychiatric: Mood and affect are normal. Speech and behavior are normal.  ____________________________________________   LABS (all labs ordered are listed, but only abnormal results are displayed)  Labs Reviewed - No data to display ____________________________________________  EKG  None - EKG not ordered by ED physician ____________________________________________  RADIOLOGY I, 06/04/20, personally viewed and evaluated these images (plain radiographs) as part of my medical decision making, as well as reviewing the written report by the radiologist.  ED MD interpretation: No indication for emergent imaging  Official radiology report(s): No results found.  ____________________________________________   PROCEDURES   Procedure(s) performed (including Critical Care):  Procedures   ____________________________________________   INITIAL IMPRESSION / MDM / ASSESSMENT AND PLAN / ED COURSE  As part of my medical decision making, I reviewed the following data within the electronic MEDICAL RECORD NUMBER Nursing notes reviewed and incorporated, Old chart reviewed and Notes from prior ED visits   Muscle strain with right-sided sciatica.  No warning signs of emergent medical condition including no suggestion of cauda equina.  Very much doubt ureteral or kidney stone.  Patient is comfortable with the plan for Lidoderm patch and outpatient prescription for Flexeril.  He will continue using ibuprofen and Tylenol.           ____________________________________________  FINAL CLINICAL IMPRESSION(S) / ED DIAGNOSES  Final diagnoses:  Acute bilateral low back pain with right-sided sciatica     MEDICATIONS GIVEN DURING THIS VISIT:  Medications  lidocaine (LIDODERM) 5 % 1 patch (has no administration in time range)     ED Discharge Orders         Ordered    cyclobenzaprine (FLEXERIL) 5 MG tablet  3 times daily PRN        06/03/20 0314    lidocaine (LIDODERM) 5 %  Every 12 hours        06/03/20 0314          *Please note:  Kyle Porter was evaluated in Emergency Department on 06/03/2020 for the symptoms described in the history of present illness. He was evaluated in the context of the global COVID-19 pandemic, which necessitated consideration that the patient might be at risk for infection with the SARS-CoV-2 virus that causes COVID-19.  Institutional protocols and algorithms that pertain to the evaluation of patients at risk for COVID-19 are in a state of rapid change based on information released by regulatory bodies including the CDC and federal and state organizations. These policies and algorithms were followed during the patient's care in the ED.  Some ED evaluations and interventions may be delayed as a result of limited staffing during and after the pandemic.*  Note:  This document was prepared using Dragon voice recognition software and may include unintentional dictation errors.   Loleta Rose, MD 06/03/20 [phone]

## 2020-06-03 NOTE — Discharge Instructions (Addendum)
You were evaluated in the Emergency Department today for back pain. Your evaluation suggests no acute abnormalities which require further intervention at this time.   - Move around as tolerated but avoiding heavy lifting. "Bed rest" is not recommended nor is it the best treatment for low back pain.  - Medications will help control your discomfort: -- Ibuprofen (800 mg three times a day with meals). -- Any other prescriptions you were provided as per label instructions  -- Do not drink alcohol, drive a car, operate machinery, or get up on ladders or heights when taking any prescribed pain medications.  -- Do not drive home if you received prescribed pain medications here in the ED.  Please follow up with your primary care physician as needed or any other providers listed in this paperwork. If you do not have a primary doctor, you can call your insurance company to find one.  If you do not have insurance, you can go to the finance/registration department for more assistance.  Return to the ED immediately if you develop any of the following problems: -- Leaking urine or difficulty urinating; -- Inability to control your bowels; -- New numbness or weakness in your legs or numbness between your legs; -- Inability to walk -- Fever

## 2021-12-02 ENCOUNTER — Encounter: Payer: Self-pay | Admitting: Emergency Medicine

## 2021-12-02 ENCOUNTER — Other Ambulatory Visit: Payer: Self-pay

## 2021-12-02 ENCOUNTER — Ambulatory Visit
Admission: EM | Admit: 2021-12-02 | Discharge: 2021-12-02 | Disposition: A | Discharge status: Home / Self Care | Payer: Self-pay | Attending: Student | Admitting: Student

## 2021-12-02 ENCOUNTER — Ambulatory Visit (INDEPENDENT_AMBULATORY_CARE_PROVIDER_SITE_OTHER): Payer: Self-pay

## 2021-12-02 DIAGNOSIS — R042 Hemoptysis: Secondary | ICD-10-CM

## 2021-12-02 DIAGNOSIS — R059 Cough, unspecified: Secondary | ICD-10-CM

## 2021-12-02 NOTE — ED Triage Notes (Addendum)
Patient in today c/o coughing up blood off & on x 2 months. Patient denies fever and has not taken any OTC medications for his symptoms. Patient states he needs a note for work. He left work on Thursday (11/30/21)

## 2021-12-02 NOTE — Discharge Instructions (Addendum)
-  Your chest x-ray looks normal, but I am concerned that you have been coughing up blood for 2 months.  You should probably see a pulmonologist to make sure that nothing is going on, cancer is unlikely with your age, but they would need to rule out lung disease. -If your symptoms get worse, follow-up with the emergency department, this includes new chest pain or shortness of breath, you are coughing up more blood, you become dizzy, you have swelling in your legs. -I am not sure if the chemicals that you were exposed to at work are contributing, so you are taking a risk by continuing the exposure.  Make sure to wear your mask as directed.

## 2021-12-02 NOTE — ED Provider Notes (Signed)
[facility]    CSN: [id number] Arrival date & time: 12/02/21  1501      History   Chief Complaint Chief Complaint  Patient presents with   coughing up blood    HPI Kyle Porter is a 26 y.o. male presenting for hemoptysis for 2.5 months.  History noncontributory, denies prior history of pulmonary disease.  Describes trace hemoptysis for about 2.5 months.  States this happens approximately every other day,, when he coughs there is trace blood in the sputum, which is otherwise clear.  There is no associated shortness of breath, chest pain, dizziness, weakness.  No fever/chills, weight loss, night sweats.  No pedal edema.  No recent travel or prolonged immobilization.  No recent falls, trauma to the extremities or chest.  The symptoms are not getting worse, and they do not wax or wane.  Patient states that he is exposed to "chemicals" at his job, but he wears a respirator or mask at all times.  States his boss wanted him to get checked out.  Never smoker.  HPI  Past Medical History:  Diagnosis Date   ADHD     There are no problems to display for this patient.   Past Surgical History:  Procedure Laterality Date   NO PAST SURGERIES         Home Medications    Prior to Admission medications   Not on File    Family History History reviewed. No pertinent family history.  Social History Social History   Tobacco Use   Smoking status: Never   Smokeless tobacco: Never  Vaping Use   Vaping Use: Never used  Substance Use Topics   Alcohol use: No   Drug use: Never     Allergies   Patient has no known allergies.   Review of Systems Review of Systems  Constitutional:  Negative for appetite change, chills and fever.  HENT:  Negative for congestion, ear pain, rhinorrhea, sinus pressure, sinus pain and sore throat.   Eyes:  Negative for redness and visual disturbance.  Respiratory:  Negative for cough, chest tightness, shortness of breath and wheezing.    Cardiovascular:  Negative for chest pain and palpitations.  Gastrointestinal:  Negative for abdominal pain, constipation, diarrhea, nausea and vomiting.  Genitourinary:  Negative for dysuria, frequency and urgency.  Musculoskeletal:  Negative for myalgias.  Neurological:  Negative for dizziness, weakness and headaches.  Psychiatric/Behavioral:  Negative for confusion.   All other systems reviewed and are negative.   Physical Exam Triage Vital Signs ED Triage Vitals [12/02/21 1509]  Enc Vitals Group     BP 109/68     Pulse Rate 61     Resp 16     Temp 98.2 F (36.8 C)     Temp Source Oral     SpO2 97 %     Weight 170 lb (77.1 kg)     Height 6\' 2"  (1.88 m)     Head Circumference      Peak Flow      Pain Score 0     Pain Loc      Pain Edu?      Excl. in [place]?    No data found.  Updated Vital Signs BP 109/68 (BP Location: Left Arm)   Pulse 61   Temp 98.2 F (36.8 C) (Oral)   Resp 16   Ht 6\' 2"  (1.88 m)   Wt 170 lb (77.1 kg)   SpO2 97%   BMI 21.83 kg/m  Visual Acuity Right Eye Distance:   Left Eye Distance:   Bilateral Distance:    Right Eye Near:   Left Eye Near:    Bilateral Near:     Physical Exam Vitals reviewed.  Constitutional:      General: He is not in acute distress.    Appearance: Normal appearance. He is not ill-appearing or diaphoretic.  HENT:     Head: Normocephalic and atraumatic.  Cardiovascular:     Rate and Rhythm: Normal rate and regular rhythm.     Heart sounds: Normal heart sounds.  Pulmonary:     Effort: Pulmonary effort is normal.     Breath sounds: Normal breath sounds.  Skin:    General: Skin is warm.  Neurological:     General: No focal deficit present.     Mental Status: He is alert and oriented to person, place, and time.  Psychiatric:        Mood and Affect: Mood normal.        Behavior: Behavior normal.        Thought Content: Thought content normal.        Judgment: Judgment normal.     UC Treatments / Results   Labs (all labs ordered are listed, but only abnormal results are displayed) Labs Reviewed - No data to display  EKG   Radiology DG Chest 2 View  Result Date: 12/02/2021 CLINICAL DATA:  Cough for 2 months. EXAM: CHEST - 2 VIEW COMPARISON:  None Available. FINDINGS: The cardiomediastinal silhouette is unremarkable. There is no evidence of focal airspace disease, pulmonary edema, suspicious pulmonary nodule/mass, pleural effusion, or pneumothorax. No acute bony abnormalities are identified. IMPRESSION: No active cardiopulmonary disease. Electronically Signed   By: Harmon Pier M.D.   On: 12/02/2021 15:50    Procedures Procedures (including critical care time)  Medications Ordered in UC Medications - No data to display  Initial Impression / Assessment and Plan / UC Course  I have reviewed the triage vital signs and the nursing notes.  Pertinent labs & imaging results that were available during my care of the patient were reviewed by me and considered in my medical decision making (see chart for details).     This patient is a very pleasant 26 y.o. year old male presenting with hemoptysis  for >2 months, unchanged.  Afebrile, nontachycardic.  Nontoxic appearing with no adventitious breath sounds.  He is a never smoker, with no close exposure to tuberculosis, no night sweats or weight loss.  Chest x-ray today is negative. Wells score for PE is 0. Possible exposure to chemicals at work per pt.  Strongly advised him to follow-up with the pulmonologist, or emergency department if symptoms worsen..   Final Clinical Impressions(s) / UC Diagnoses   Final diagnoses:  Hemoptysis     Discharge Instructions      -Your chest x-ray looks normal, but I am concerned that you have been coughing up blood for 2 months.  You should probably see a pulmonologist to make sure that nothing is going on, cancer is unlikely with your age, but they would need to rule out lung disease. -If your symptoms get  worse, follow-up with the emergency department, this includes new chest pain or shortness of breath, you are coughing up more blood, you become dizzy, you have swelling in your legs. -I am not sure if the chemicals that you were exposed to at work are contributing, so you are taking a risk by continuing the exposure.  Make sure to wear your mask as directed.    ED Prescriptions   None    PDMP not reviewed this encounter.   Hazel Sams, PA-C 12/02/21 [phone]

## 2022-01-31 NOTE — Progress Notes (Deleted)
    New patient visit   Patient: Kyle Porter   DOB: October 07, 1995   26 y.o. Male  MRN: [id number] Visit Date: 02/01/2022  Today's healthcare provider: Debera Lat, PA-C   No chief complaint on file.  Subjective    Kyle Porter is a 26 y.o. male who presents today as a new patient to establish care.  HPI  ***  Past Medical History:  Diagnosis Date   ADHD    Past Surgical History:  Procedure Laterality Date   NO PAST SURGERIES     No family status information on file.   No family history on file. Social History   Socioeconomic History   Marital status: Single    Spouse name: Not on file   Number of children: Not on file   Years of education: Not on file   Highest education level: Not on file  Occupational History   Not on file  Tobacco Use   Smoking status: Never   Smokeless tobacco: Never  Vaping Use   Vaping Use: Never used  Substance and Sexual Activity   Alcohol use: No   Drug use: Never   Sexual activity: Not on file  Other Topics Concern   Not on file  Social History Narrative   Not on file   Social Determinants of Health   Financial Resource Strain: Not on file  Food Insecurity: Not on file  Transportation Needs: Not on file  Physical Activity: Not on file  Stress: Not on file  Social Connections: Not on file   No outpatient medications prior to visit.   No facility-administered medications prior to visit.   No Known Allergies   There is no immunization history on file for this patient.  Health Maintenance  Topic Date Due   COVID-19 Vaccine (1) Never done   HPV VACCINES (1 - Male 2-dose series) Never done   HIV Screening  Never done   Hepatitis C Screening  Never done   TETANUS/TDAP  Never done   INFLUENZA VACCINE  02/13/2022    Patient Care Team: Patient, No Pcp Per as PCP - General (General Practice) Shuler, Dario Guardian, MD as Referring Physician (Pediatrics) Kieth Brightly, MD (General Surgery)  Review of  Systems  {Labs  Heme  Chem  Endocrine  Serology  Results Review (optional):23779}   Objective    There were no vitals taken for this visit. {Show previous vital signs (optional):23777}  Physical Exam ***  Depression Screen     No data to display         No results found for any visits on 02/01/22.  Assessment & Plan     ***  No follow-ups on file.     {provider attestation***:1}   Debera Lat, Cordelia Poche  [facility] [phone] (phone) [phone] (fax)  [facility]

## 2022-02-01 ENCOUNTER — Ambulatory Visit: Payer: Self-pay | Admitting: Physician Assistant

## 2023-03-29 IMAGING — CR DG CHEST 2V
2 series · 2 of 2 positions shown · non-contrast
Comparison: None Available.

CLINICAL DATA: Cough for 2 months.

EXAM:
CHEST - 2 VIEW

[chest pa]
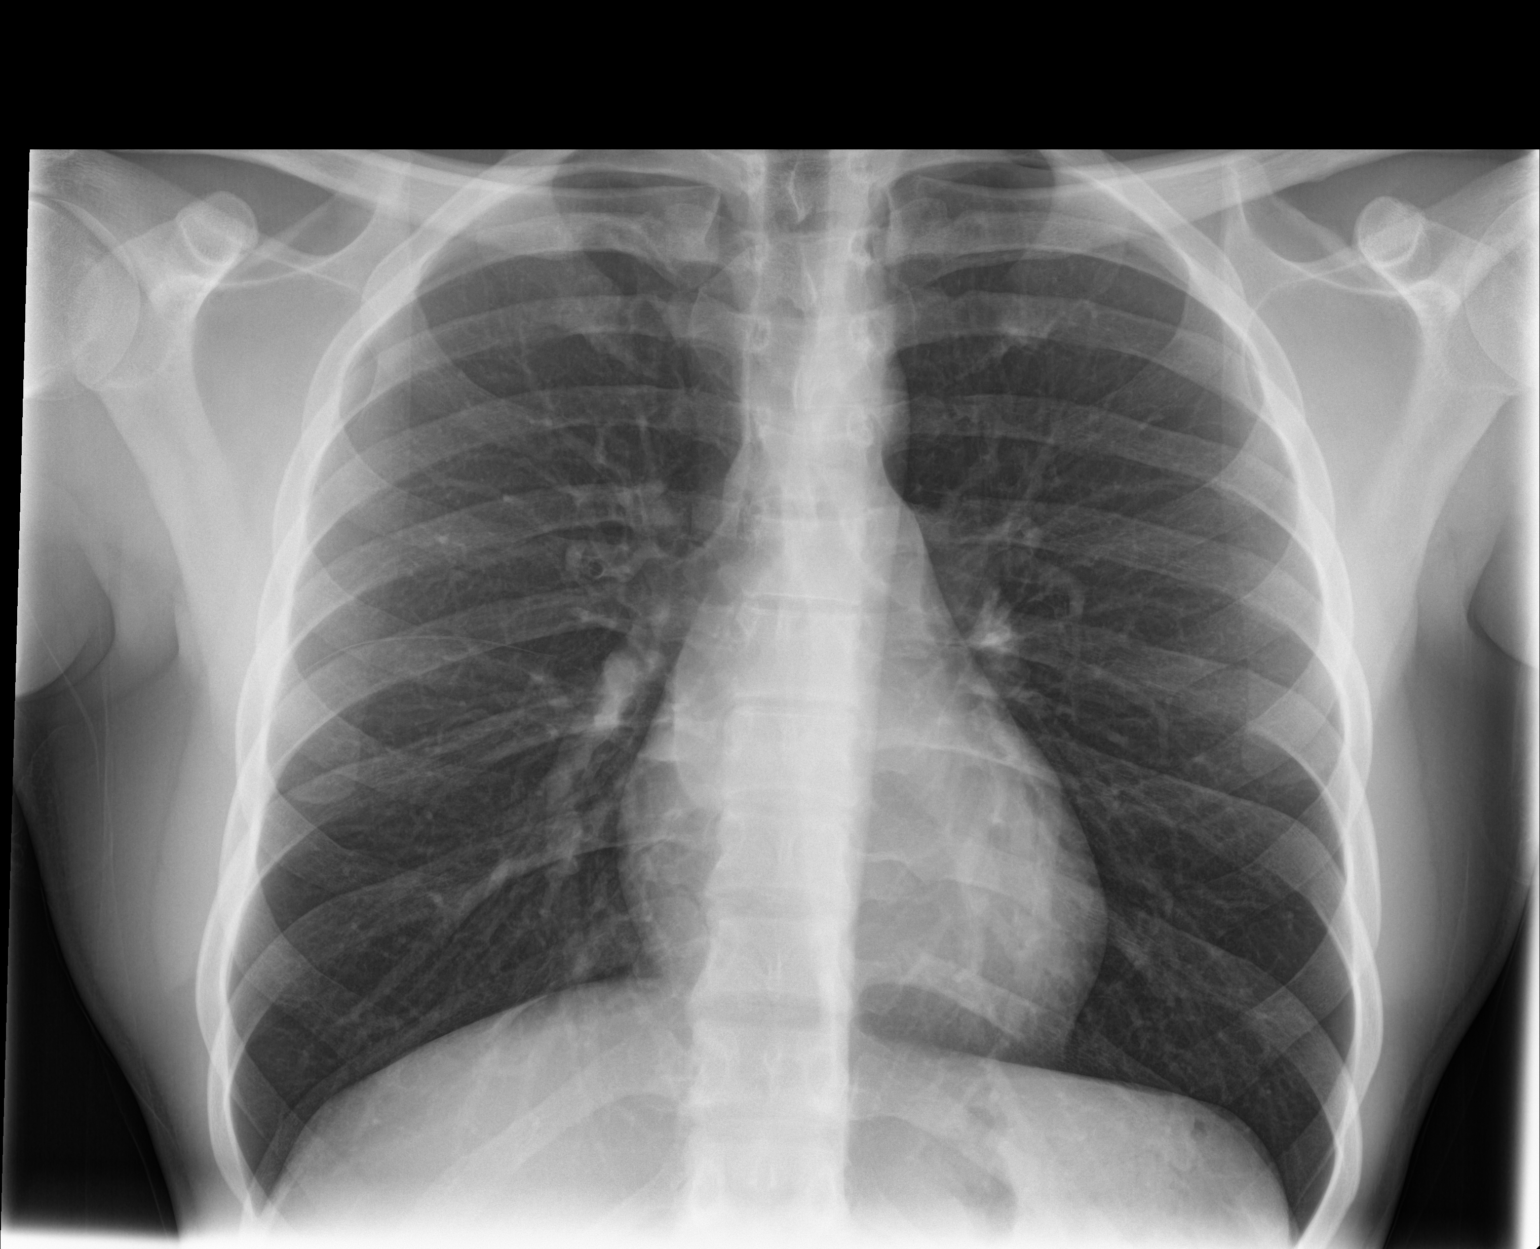

[chest lat]
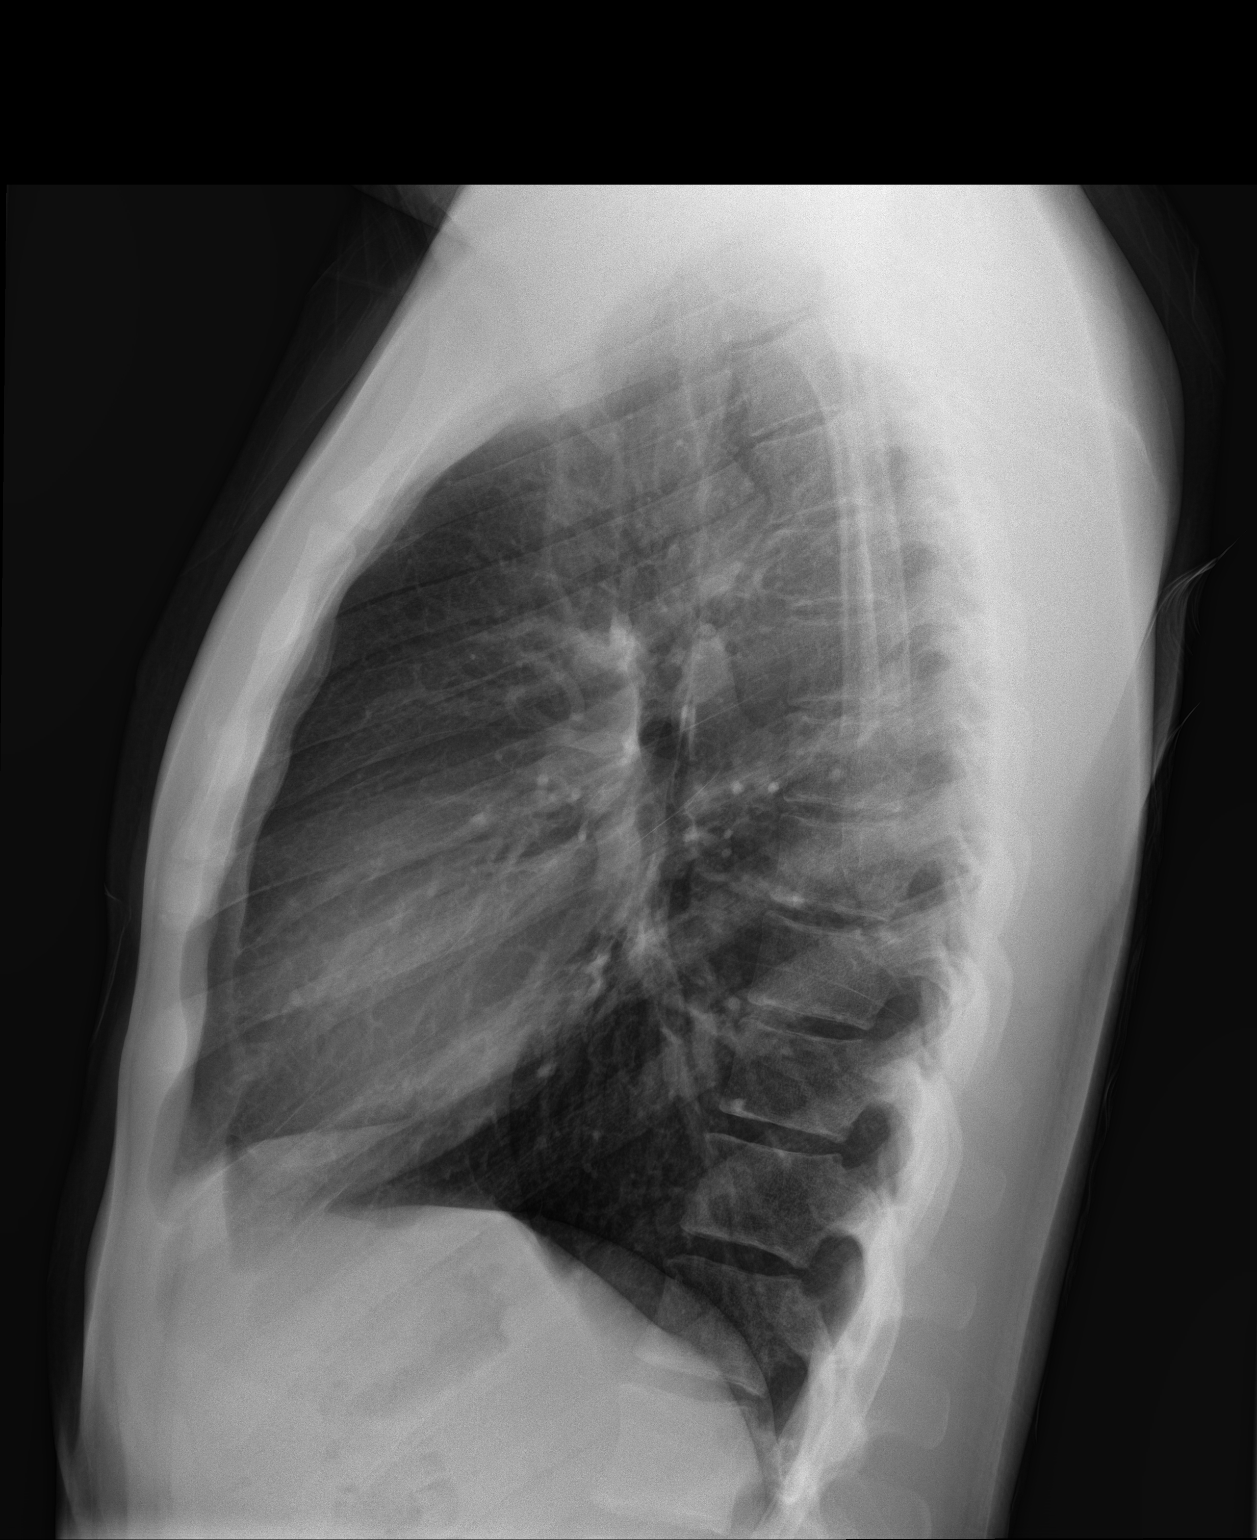

[2 of 2 positions shown; findings below may reference images not displayed]

FINDINGS: The cardiomediastinal silhouette is unremarkable.

There is no evidence of focal airspace disease, pulmonary edema,
suspicious pulmonary nodule/mass, pleural effusion, or pneumothorax.

No acute bony abnormalities are identified.
IMPRESSION: No active cardiopulmonary disease.
